# Patient Record
Sex: Female | Born: 2000 | Race: White | Hispanic: No | Marital: Single
Health system: Southern US, Community
[De-identification: ages and names within clinical notes are randomized; demographics above are authoritative.]

---
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
## 2020-04-18 IMAGING — DX DG KNEE AP/LAT W/ SUNRISE*R*
3 series · 3 of 3 positions shown · non-contrast
Comparison: None.

CLINICAL DATA: Hx surg right knee torn ACL several years ago,
recent lacrosse injury, pain

EXAM:
RIGHT KNEE 3 VIEWS

[dg knee ap/lat w/ sunrise right (1 of 3)]
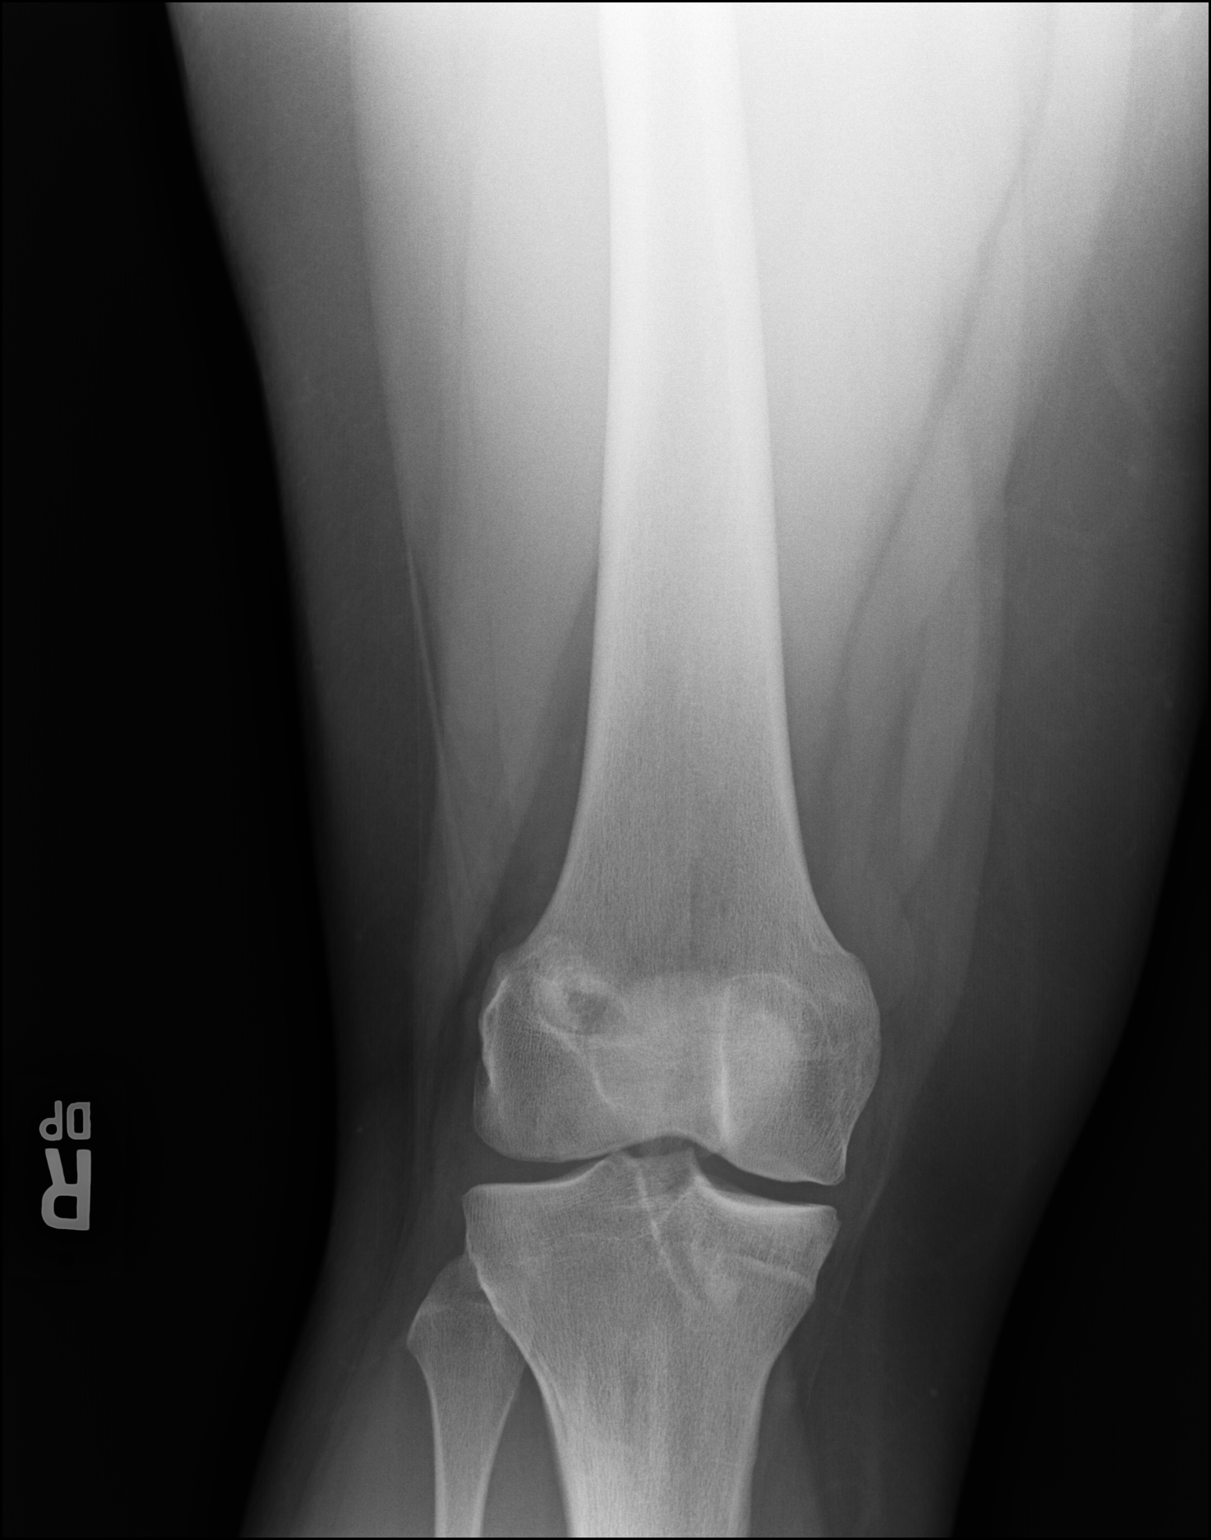

[dg knee ap/lat w/ sunrise right (2 of 3)]
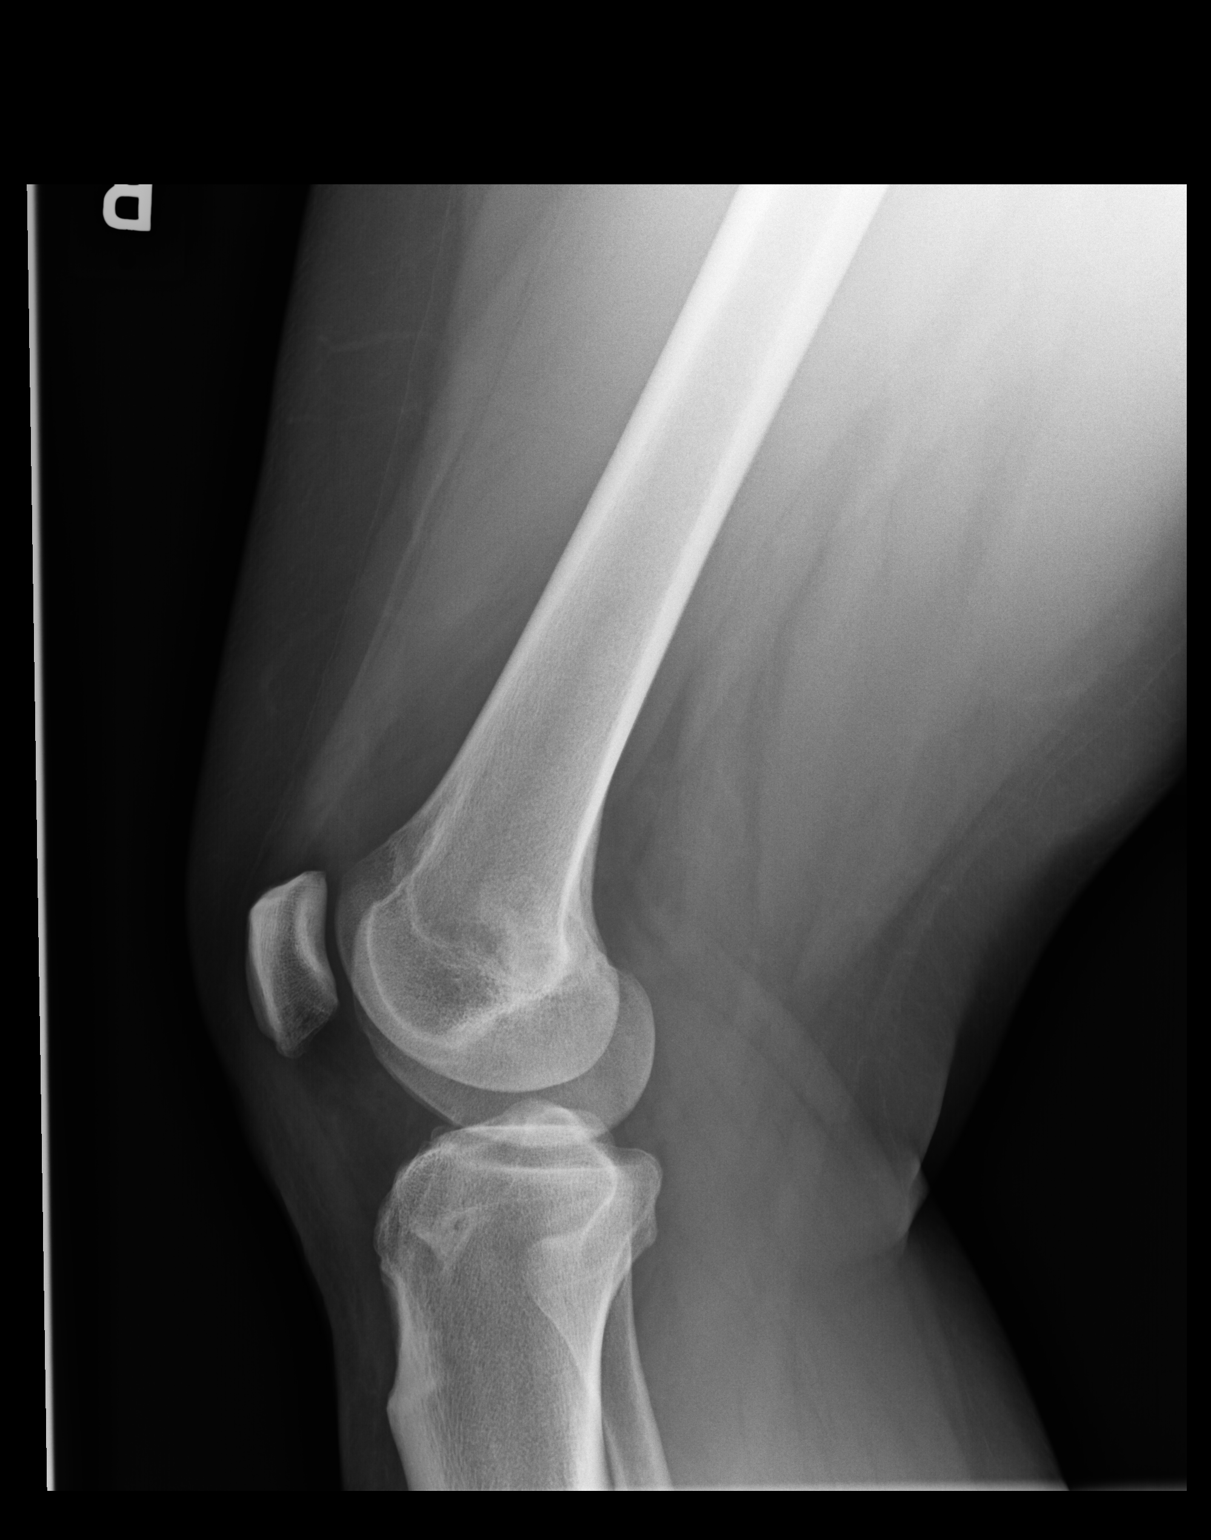

[dg knee ap/lat w/ sunrise right (3 of 3)]
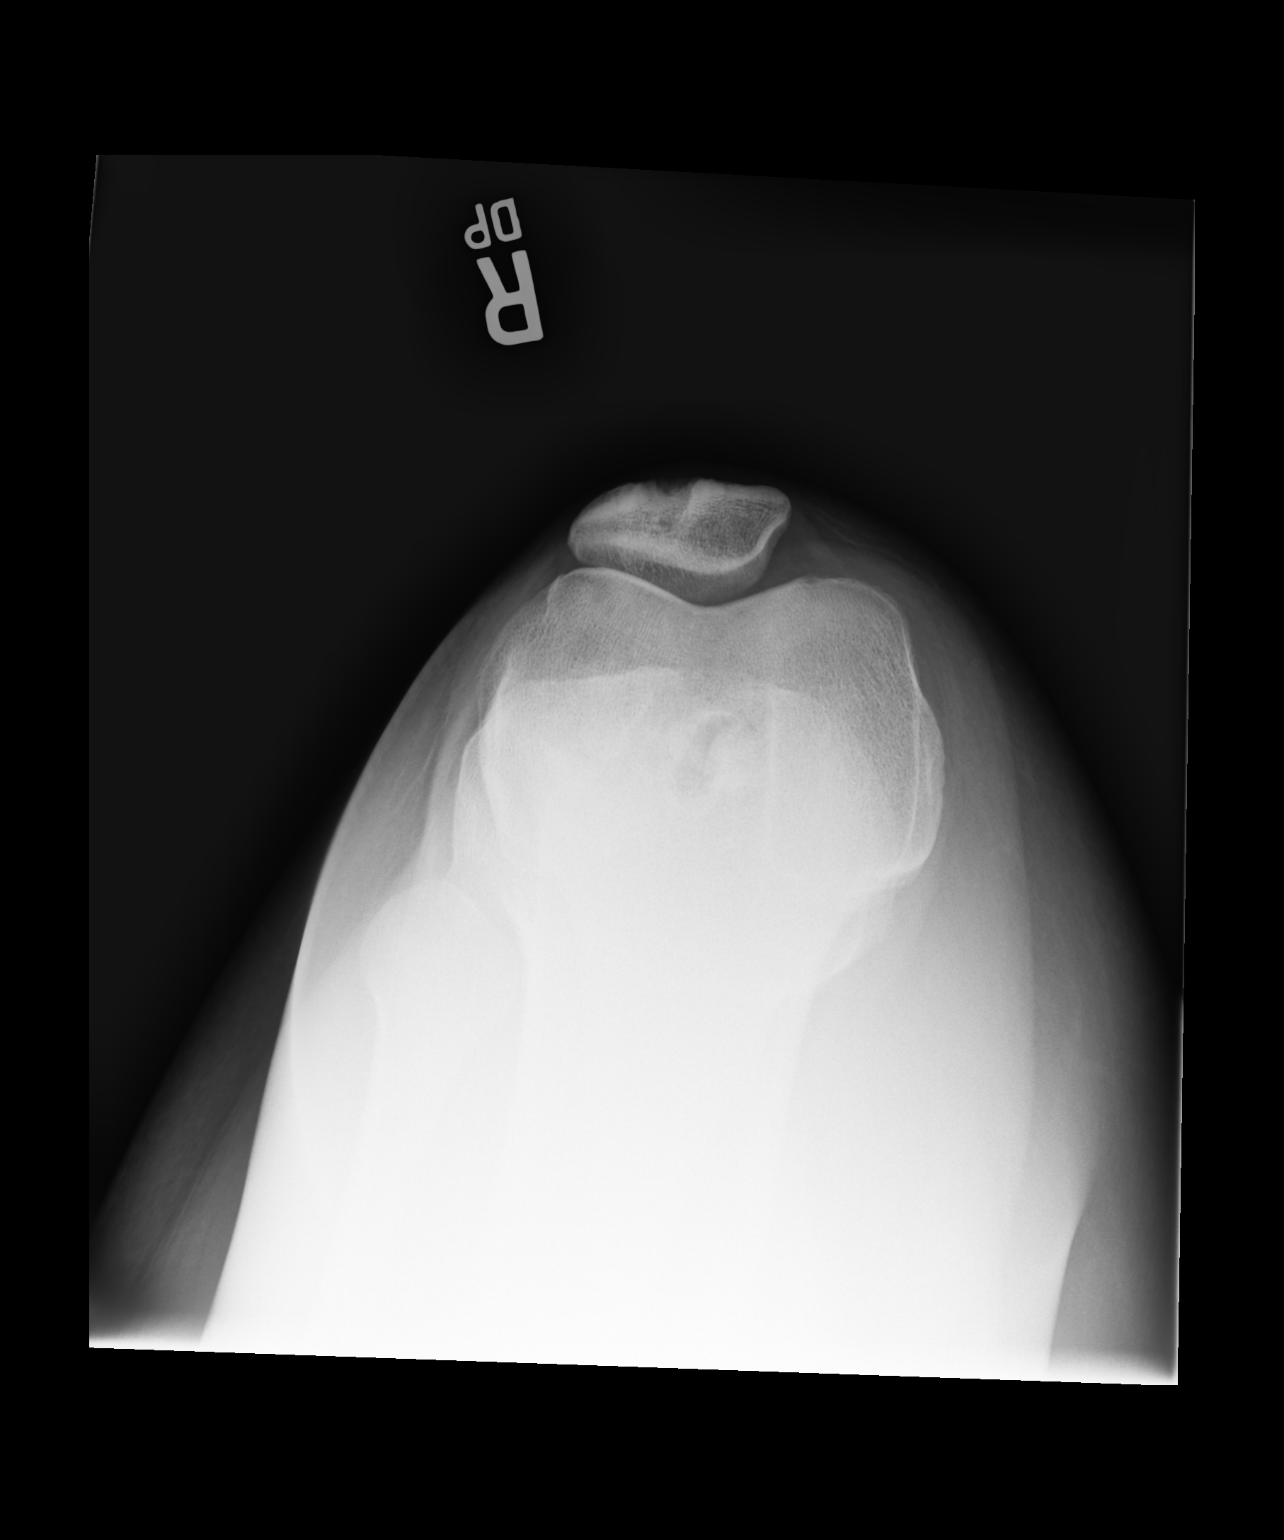

[3 of 3 positions shown; findings below may reference images not displayed]

FINDINGS: Surgical changes of a previous ACL repair. No evidence of acute
fracture line or displaced fracture fragment. Osseous alignment is
normal. No appreciable joint effusion and adjacent soft tissues are
unremarkable.
IMPRESSION: No acute findings.
